# Patient Record
Sex: Male | Born: 1980 | Race: White | Hispanic: No | Marital: Married | Smoking: Never smoker
Health system: Southern US, Community
[De-identification: ages and names within clinical notes are randomized; demographics above are authoritative.]

---

## 2008-09-19 ENCOUNTER — Encounter: Admission: RE | Admit: 2008-09-19 | Discharge: 2008-09-19 | Payer: Self-pay | Admitting: Occupational Medicine

## 2010-04-02 IMAGING — CR DG CHEST 1V
1 series · 1 of 1 positions shown · non-contrast
Comparison: None

CLINICAL DATA: Screening.

CHEST - 1 VIEW

[view not recorded]
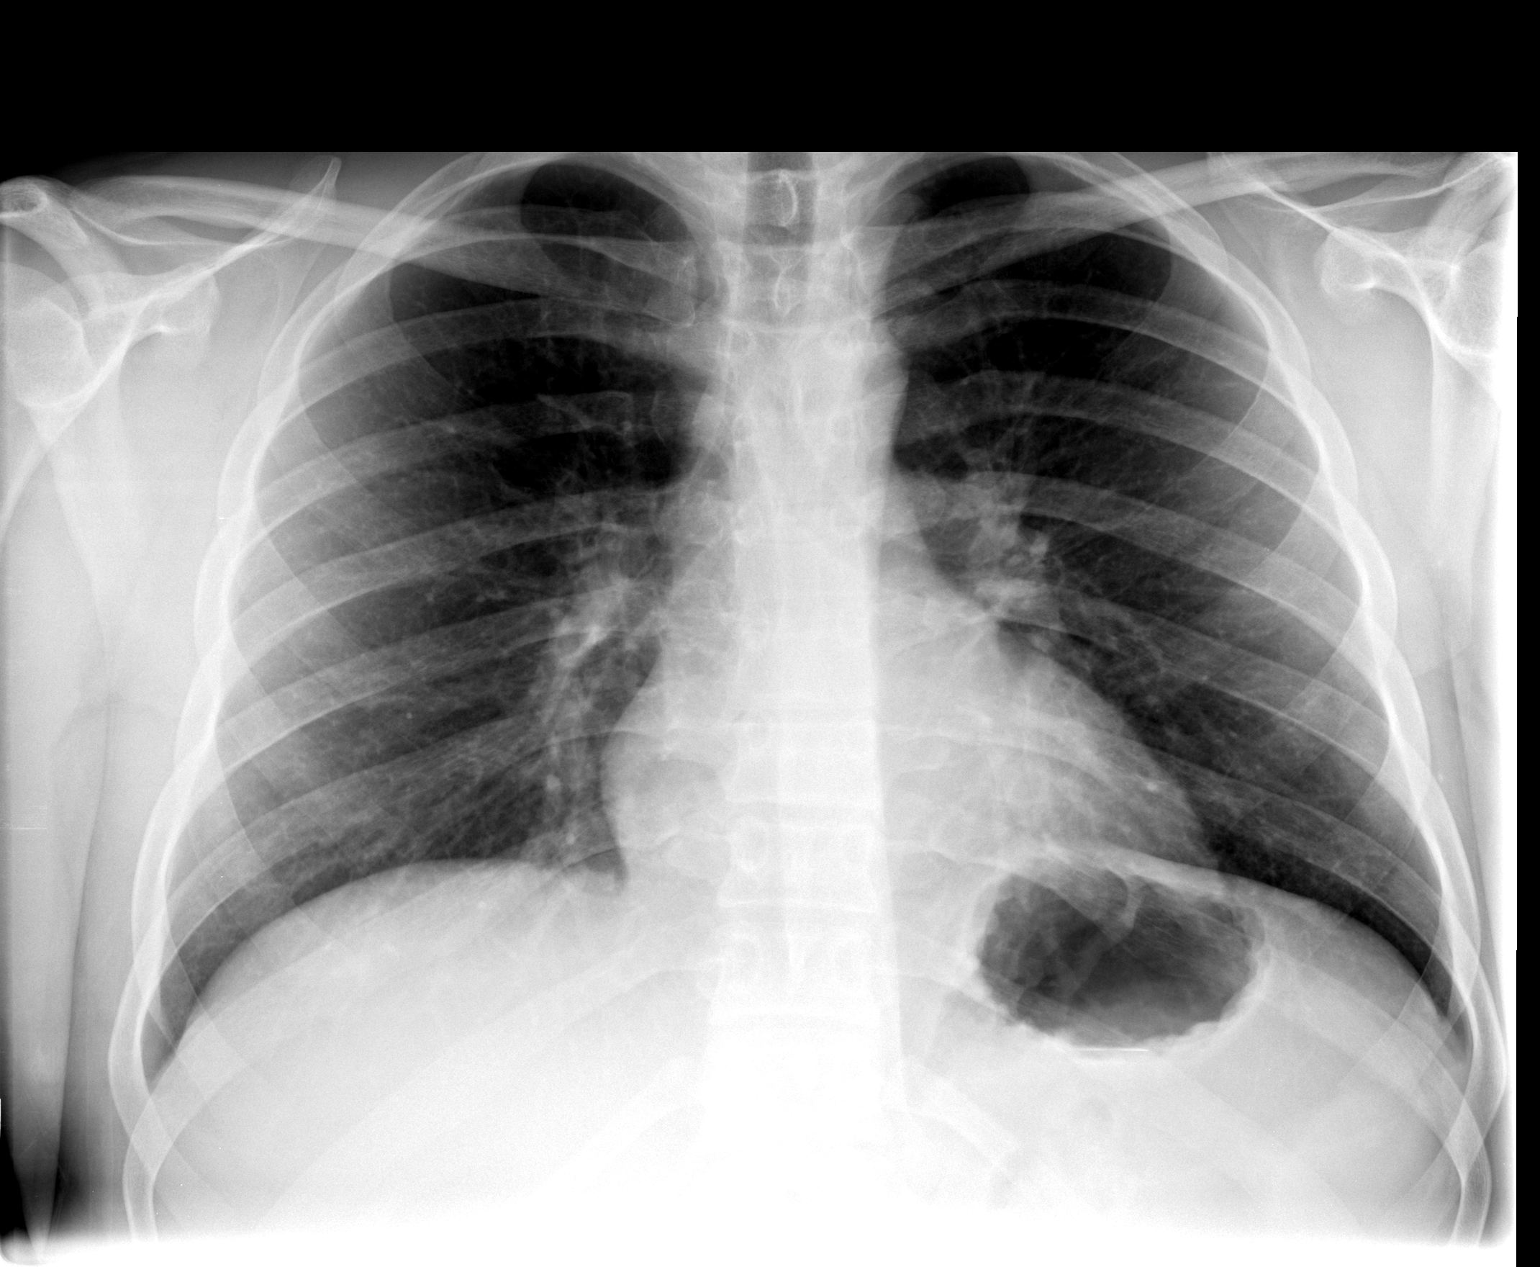

[1 of 1 positions shown; findings below may reference images not displayed]

FINDINGS: Trachea is midline.  Heart size normal.  Lungs are clear.
No pleural fluid.
IMPRESSION: No acute findings.

## 2012-06-19 ENCOUNTER — Emergency Department: Payer: Self-pay | Admitting: Emergency Medicine

## 2012-06-25 ENCOUNTER — Emergency Department: Payer: Self-pay | Admitting: Internal Medicine

## 2017-03-21 DIAGNOSIS — Z0000 Encounter for general adult medical examination without abnormal findings: Secondary | ICD-10-CM | POA: Diagnosis not present

## 2018-01-31 ENCOUNTER — Ambulatory Visit
Admission: EM | Admit: 2018-01-31 | Discharge: 2018-01-31 | Disposition: A | Discharge status: Home / Self Care | Payer: 59 | Attending: Emergency Medicine | Admitting: Emergency Medicine

## 2018-01-31 ENCOUNTER — Other Ambulatory Visit: Payer: Self-pay

## 2018-01-31 DIAGNOSIS — H1032 Unspecified acute conjunctivitis, left eye: Secondary | ICD-10-CM | POA: Diagnosis not present

## 2018-01-31 DIAGNOSIS — R300 Dysuria: Secondary | ICD-10-CM

## 2018-01-31 MED ORDER — MOXIFLOXACIN HCL 0.5 % OP SOLN: 1.0000 [drp] | Freq: Three times a day (TID) | OPHTHALMIC | 0 refills | Status: AC

## 2018-01-31 NOTE — ED Triage Notes (Signed)
Pt with left eye redness and irritation. Reports was "crusty" this morning.  VISUAL ACUITY: Uncorrected left eye 20/15 Uncorrected right eye 20/20 Uncorrected both eyes 20/13

## 2018-01-31 NOTE — ED Provider Notes (Signed)
[facility]    CSN: [id number] Arrival date & time: 01/31/18  0848     History   Chief Complaint Chief Complaint  Patient presents with  . Conjunctivitis    HPI Sean Holt is a 37 y.o. male.   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  He recently returned from a 4-day cruise returning on Monday.  He also complained of urinary burning with  initiation of micturition but denies penile discharge.  He has had no arthritis complaints.  Had no fever or chills.  Not remember any contacts with his symptoms.  He has 2 children at home both healthy.  He denies photophobia.  He does not wear contact lenses.  Visual acuity is normal.  He does have feeling of foreign body in the eye; is not squinting.         History reviewed. No pertinent past medical history.  There are no active problems to display for this patient.   History reviewed. No pertinent surgical history.     Home Medications    Prior to Admission medications   Medication Sig Start Date End Date Taking? Authorizing Provider  moxifloxacin (VIGAMOX) 0.5 % ophthalmic solution Place 1 drop into the left eye 3 (three) times daily. 01/31/18   Lutricia Feil, PA-C    Family History History reviewed. No pertinent family history.  Social History Social History   Tobacco Use  . Smoking status: Never Smoker  . Smokeless tobacco: Current User    Types: Chew  Substance Use Topics  . Alcohol use: Never    Frequency: Never  . Drug use: Never     Allergies   Patient has no known allergies.   Review of Systems Review of Systems  Constitutional: Positive for activity change. Negative for chills, fatigue and fever.  HENT: Negative for congestion.   Eyes: Positive for discharge and redness. Negative for photophobia, pain, itching and visual disturbance.  All other systems reviewed and are  negative.    Physical Exam Triage Vital Signs ED Triage Vitals  Enc Vitals Group     BP 01/31/18 0902 (!) 119/97     Pulse Rate 01/31/18 0902 100     Resp 01/31/18 0902 18     Temp 01/31/18 0902 98.4 F (36.9 C)     Temp Source 01/31/18 0902 Oral     SpO2 01/31/18 0902 98 %     Weight 01/31/18 0901 225 lb (102.1 kg)     Height 01/31/18 0901 5\' 11"  (1.803 m)     Head Circumference --      Peak Flow --      Pain Score 01/31/18 0900 0     Pain Loc --      Pain Edu? --      Excl. in GC? --    No data found.  Updated Vital Signs BP (!) 119/97 (BP Location: Right Arm)   Pulse 100   Temp 98.4 F (36.9 C) (Oral)   Resp 18   Ht 5\' 11"  (1.803 m)   Wt 225 lb (102.1 kg)   SpO2 98%   BMI 31.38 kg/m   Visual Acuity Right Eye Distance:   Left Eye Distance:   Bilateral Distance:    Right Eye Near:   Left Eye Near:    Bilateral Near:     Physical Exam  Constitutional: He is oriented to person, place, and  time. He appears well-developed and well-nourished. No distress.  HENT:  Head: Normocephalic.  Right Ear: External ear normal.  Left Ear: External ear normal.  Nose: Nose normal.  Mouth/Throat: Oropharynx is clear and moist. No oropharyngeal exudate.  Eyes: Pupils are equal, round, and reactive to light. EOM are normal. Right eye exhibits no discharge. Left eye exhibits discharge.  Examination of the left eye shows PERRLA EOMs are intact.  Visual acuity is normal left eye 20/15 right 20/20 both 20/13 all uncorrected.  Conjunctive is inflamed and erythematous.  He has no crusting present at the time upper lids lower lids are normal.  Her left eyelid was everted with no findings of foreign body material.  The left eye was then anesthetized with tetracaine.  Her adequate anesthesia of the eye was stained with fluorescein and examined under [facility].  Cornea appeared normal.  Neck: Normal range of motion. Neck supple.  Musculoskeletal: Normal range of motion.   Neurological: He is alert and oriented to person, place, and time.  Skin: Skin is warm and dry. He is not diaphoretic.  Psychiatric: He has a normal mood and affect. His behavior is normal. Judgment and thought content normal.  Nursing note and vitals reviewed.    UC Treatments / Results  Labs (all labs ordered are listed, but only abnormal results are displayed) Labs Reviewed - No data to display  EKG None  Radiology No results found.  Procedures Procedures (including critical care time)  Medications Ordered in UC Medications - No data to display  Initial Impression / Assessment and Plan / UC Course  I have reviewed the triage vital signs and the nursing notes.  Pertinent labs & imaging results that were available during my care of the patient were reviewed by me and considered in my medical decision making (see chart for details).     Told patient that cool compresses may provide him with some comfort.  Treat this is a bacterial conjunctivitis but possibly could represent viral. His urinary  Symptoms do not appear connected. Recommended that he consider following up with his primary care physician if this continues. If  The eye sx. continues to have the conjunctivitis after 2 more days despite the use of the eyedrops, recommend that he follow-up with [facility]. Final Clinical Impressions(s) / UC Diagnoses   Final diagnoses:  Acute bacterial conjunctivitis of left eye   Discharge Instructions   None    ED Prescriptions    Medication Sig Dispense Auth. Provider   moxifloxacin (VIGAMOX) 0.5 % ophthalmic solution Place 1 drop into the left eye 3 (three) times daily. 3 mL Lutricia Feil, PA-C     Controlled Substance Prescriptions [place] Controlled Substance Registry consulted? Not Applicable   Lutricia Feil, PA-C 01/31/18 1610

## 2018-03-25 DIAGNOSIS — Z0000 Encounter for general adult medical examination without abnormal findings: Secondary | ICD-10-CM | POA: Diagnosis not present
